# Patient Record
Sex: Female | Born: 2000 | Race: White | Hispanic: No | Marital: Single | Smoking: Never smoker
Health system: Southern US, Community
[De-identification: ages and names within clinical notes are randomized; demographics above are authoritative.]

## PROBLEM LIST (undated history)

## (undated) DIAGNOSIS — J45909 Unspecified asthma, uncomplicated: Secondary | ICD-10-CM

---
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
## 2021-02-15 ENCOUNTER — Emergency Department (HOSPITAL_COMMUNITY): Payer: Medicaid Other

## 2021-02-15 ENCOUNTER — Other Ambulatory Visit: Payer: Self-pay

## 2021-02-15 ENCOUNTER — Encounter (HOSPITAL_COMMUNITY): Payer: Self-pay | Admitting: Emergency Medicine

## 2021-02-15 ENCOUNTER — Emergency Department (HOSPITAL_COMMUNITY)
Admission: EM | Admit: 2021-02-15 | Discharge: 2021-02-16 | Disposition: A | Discharge status: Home / Self Care | Payer: Medicaid Other | Attending: Emergency Medicine | Admitting: Emergency Medicine

## 2021-02-15 DIAGNOSIS — S0101XA Laceration without foreign body of scalp, initial encounter: Secondary | ICD-10-CM | POA: Diagnosis not present

## 2021-02-15 DIAGNOSIS — R0789 Other chest pain: Secondary | ICD-10-CM | POA: Diagnosis not present

## 2021-02-15 DIAGNOSIS — Y92410 Unspecified street and highway as the place of occurrence of the external cause: Secondary | ICD-10-CM | POA: Insufficient documentation

## 2021-02-15 DIAGNOSIS — Z79899 Other long term (current) drug therapy: Secondary | ICD-10-CM | POA: Insufficient documentation

## 2021-02-15 DIAGNOSIS — R109 Unspecified abdominal pain: Secondary | ICD-10-CM | POA: Insufficient documentation

## 2021-02-15 DIAGNOSIS — S0990XA Unspecified injury of head, initial encounter: Secondary | ICD-10-CM | POA: Diagnosis present

## 2021-02-15 DIAGNOSIS — S8391XA Sprain of unspecified site of right knee, initial encounter: Secondary | ICD-10-CM | POA: Insufficient documentation

## 2021-02-15 DIAGNOSIS — S50311A Abrasion of right elbow, initial encounter: Secondary | ICD-10-CM | POA: Insufficient documentation

## 2021-02-15 LAB — COMPREHENSIVE METABOLIC PANEL
ALT: 30 U/L (ref 0–44)
AST: 30 U/L (ref 15–41)
Albumin: 4 g/dL (ref 3.5–5.0)
Alkaline Phosphatase: 59 U/L (ref 38–126)
Anion gap: 5 (ref 5–15)
BUN: 8 mg/dL (ref 6–20)
CO2: 21 mmol/L — ABNORMAL LOW (ref 22–32)
Calcium: 8.8 mg/dL — ABNORMAL LOW (ref 8.9–10.3)
Chloride: 108 mmol/L (ref 98–111)
Creatinine, Ser: 1.08 mg/dL — ABNORMAL HIGH (ref 0.44–1.00)
GFR, Estimated: >60 mL/min (ref >60)
Glucose, Bld: 106 mg/dL — ABNORMAL HIGH (ref 70–99)
Potassium: 3.8 mmol/L (ref 3.5–5.1)
Sodium: 134 mmol/L — ABNORMAL LOW (ref 135–145)
Total Bilirubin: 1.2 mg/dL (ref 0.3–1.2)
Total Protein: 7.4 g/dL (ref 6.5–8.1)

## 2021-02-15 LAB — I-STAT CHEM 8, ED
BUN: 7 mg/dL (ref 6–20)
Calcium, Ion: 1.16 mmol/L (ref 1.15–1.40)
Chloride: 103 mmol/L (ref 98–111)
Creatinine, Ser: 1 mg/dL (ref 0.44–1.00)
Glucose, Bld: 104 mg/dL — ABNORMAL HIGH (ref 70–99)
HCT: 51 % — ABNORMAL HIGH (ref 36.0–46.0)
Hemoglobin: 17.3 g/dL — ABNORMAL HIGH (ref 12.0–15.0)
Potassium: 3.8 mmol/L (ref 3.5–5.1)
Sodium: 140 mmol/L (ref 135–145)
TCO2: 25 mmol/L (ref 22–32)

## 2021-02-15 LAB — CBC
HCT: 50.7 % — ABNORMAL HIGH (ref 36.0–46.0)
Hemoglobin: 17.3 g/dL — ABNORMAL HIGH (ref 12.0–15.0)
MCH: 33.3 pg (ref 26.0–34.0)
MCHC: 34.1 g/dL (ref 30.0–36.0)
MCV: 97.7 fL (ref 80.0–100.0)
Platelets: 255 10*3/uL (ref 150–400)
RBC: 5.19 MIL/uL — ABNORMAL HIGH (ref 3.87–5.11)
RDW: 12.9 % (ref 11.5–15.5)
WBC: 16.1 10*3/uL — ABNORMAL HIGH (ref 4.0–10.5)
nRBC: 0 % (ref 0.0–0.2)

## 2021-02-15 LAB — LACTIC ACID, PLASMA: Lactic Acid, Venous: 1.4 mmol/L (ref 0.5–1.9)

## 2021-02-15 LAB — I-STAT BETA HCG BLOOD, ED (MC, WL, AP ONLY): I-stat hCG, quantitative: <5 m[IU]/mL (ref <5)

## 2021-02-15 LAB — PROTIME-INR
INR: 1.1 (ref 0.8–1.2)
Prothrombin Time: 14.1 seconds (ref 11.4–15.2)

## 2021-02-15 LAB — SAMPLE TO BLOOD BANK

## 2021-02-15 LAB — ETHANOL: Alcohol, Ethyl (B): <10 mg/dL (ref <10)

## 2021-02-15 MED ORDER — MORPHINE SULFATE (PF) 4 MG/ML IV SOLN
4.0000 mg | Freq: Once | INTRAVENOUS | Status: AC
  Administered 2021-02-15: 4 mg via INTRAVENOUS
  Filled 2021-02-15: qty 1

## 2021-02-15 MED ORDER — IOHEXOL 300 MG/ML  SOLN
100.0000 mL | Freq: Once | INTRAMUSCULAR | Status: AC | PRN
  Administered 2021-02-15: 100 mL via INTRAVENOUS

## 2021-02-15 MED ORDER — ONDANSETRON HCL 4 MG/2ML IJ SOLN
4.0000 mg | Freq: Once | INTRAMUSCULAR | Status: AC
  Administered 2021-02-15: 4 mg via INTRAVENOUS
  Filled 2021-02-15: qty 2

## 2021-02-15 MED ORDER — LIDOCAINE-EPINEPHRINE (PF) 2 %-1:200000 IJ SOLN
20.0000 mL | Freq: Once | INTRAMUSCULAR | Status: AC
  Administered 2021-02-15: 20 mL
  Filled 2021-02-15: qty 20
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
## 2021-02-16 MED ORDER — IBUPROFEN 600 MG PO TABS: 600.0000 mg | ORAL_TABLET | Freq: Four times a day (QID) | ORAL | 0 refills | Status: AC | PRN

## 2021-02-16 MED ORDER — CYCLOBENZAPRINE HCL 10 MG PO TABS: 10.0000 mg | ORAL_TABLET | Freq: Two times a day (BID) | ORAL | 0 refills | Status: AC | PRN
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
